# Patient Record
Sex: Male | Born: 1975 | Race: White | Hispanic: No | Marital: Married | Smoking: Current every day smoker
Health system: Southern US, Community
[De-identification: ages and names within clinical notes are randomized; demographics above are authoritative.]

## PROBLEM LIST (undated history)

## (undated) HISTORY — PX: FOOT SURGERY: SHX648

## (undated) HISTORY — PX: OTHER SURGICAL HISTORY: SHX169

---

## 2004-11-23 ENCOUNTER — Emergency Department (HOSPITAL_COMMUNITY): Admission: EM | Admit: 2004-11-23 | Discharge: 2004-11-23 | Payer: Self-pay | Admitting: Emergency Medicine

## 2019-04-14 ENCOUNTER — Other Ambulatory Visit: Payer: Self-pay | Admitting: Family Medicine

## 2019-04-14 DIAGNOSIS — R1011 Right upper quadrant pain: Secondary | ICD-10-CM

## 2019-04-24 ENCOUNTER — Other Ambulatory Visit (HOSPITAL_COMMUNITY): Payer: Self-pay | Admitting: Family Medicine

## 2019-04-24 ENCOUNTER — Other Ambulatory Visit: Payer: Self-pay | Admitting: Family Medicine

## 2019-04-24 ENCOUNTER — Ambulatory Visit
Admission: RE | Admit: 2019-04-24 | Discharge: 2019-04-24 | Disposition: A | Discharge status: Home / Self Care | Payer: BLUE CROSS/BLUE SHIELD | Source: Ambulatory Visit | Attending: Family Medicine | Admitting: Family Medicine

## 2019-04-24 DIAGNOSIS — R1011 Right upper quadrant pain: Secondary | ICD-10-CM

## 2019-04-28 ENCOUNTER — Other Ambulatory Visit: Payer: Self-pay

## 2019-04-28 ENCOUNTER — Ambulatory Visit (HOSPITAL_COMMUNITY)
Admission: RE | Admit: 2019-04-28 | Discharge: 2019-04-28 | Disposition: A | Discharge status: Home / Self Care | Payer: BLUE CROSS/BLUE SHIELD | Source: Ambulatory Visit | Attending: Family Medicine | Admitting: Family Medicine

## 2019-04-28 DIAGNOSIS — R1011 Right upper quadrant pain: Secondary | ICD-10-CM | POA: Diagnosis present

## 2019-04-28 MED ORDER — TECHNETIUM TC 99M MEBROFENIN IV KIT
5.0000 | PACK | Freq: Once | INTRAVENOUS | Status: AC | PRN
  Administered 2019-04-28: 5 via INTRAVENOUS

## 2019-05-01 ENCOUNTER — Other Ambulatory Visit: Payer: Self-pay | Admitting: Family Medicine

## 2019-05-01 DIAGNOSIS — R1011 Right upper quadrant pain: Secondary | ICD-10-CM

## 2019-05-02 ENCOUNTER — Ambulatory Visit
Admission: RE | Admit: 2019-05-02 | Discharge: 2019-05-02 | Disposition: A | Discharge status: Home / Self Care | Payer: BLUE CROSS/BLUE SHIELD | Source: Ambulatory Visit | Attending: Family Medicine | Admitting: Family Medicine

## 2019-05-02 ENCOUNTER — Other Ambulatory Visit: Payer: Self-pay

## 2019-05-02 DIAGNOSIS — R1011 Right upper quadrant pain: Secondary | ICD-10-CM

## 2019-05-02 MED ORDER — IOPAMIDOL (ISOVUE-300) INJECTION 61%
100.0000 mL | Freq: Once | INTRAVENOUS | Status: AC | PRN
  Administered 2019-05-02: 15:00:00 100 mL via INTRAVENOUS

## 2019-11-21 ENCOUNTER — Other Ambulatory Visit: Payer: Self-pay | Admitting: Obstetrics and Gynecology

## 2019-11-21 ENCOUNTER — Ambulatory Visit
Admission: RE | Admit: 2019-11-21 | Discharge: 2019-11-21 | Disposition: A | Discharge status: Home / Self Care | Payer: BC Managed Care – PPO | Source: Ambulatory Visit | Attending: Obstetrics and Gynecology | Admitting: Obstetrics and Gynecology

## 2019-11-21 DIAGNOSIS — R52 Pain, unspecified: Secondary | ICD-10-CM

## 2020-07-27 IMAGING — US ULTRASOUND ABDOMEN LIMITED
1 series · 14 of 25 positions shown · non-contrast
Comparison: None.

CLINICAL DATA: Right upper quadrant abdominal pain

EXAM:
ULTRASOUND ABDOMEN LIMITED RIGHT UPPER QUADRANT

[Series 1: ultrasound abdomen limited · 0.23mm/px · 14 of 37 slices shown]
[im 1/37]
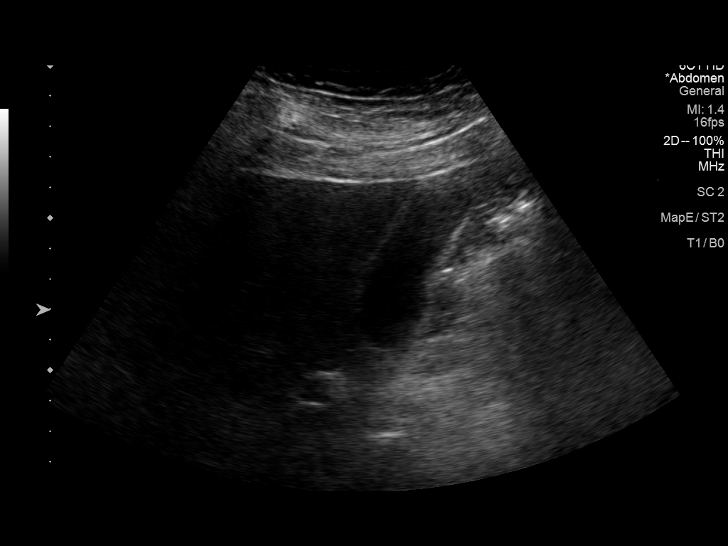
[im 4/37]
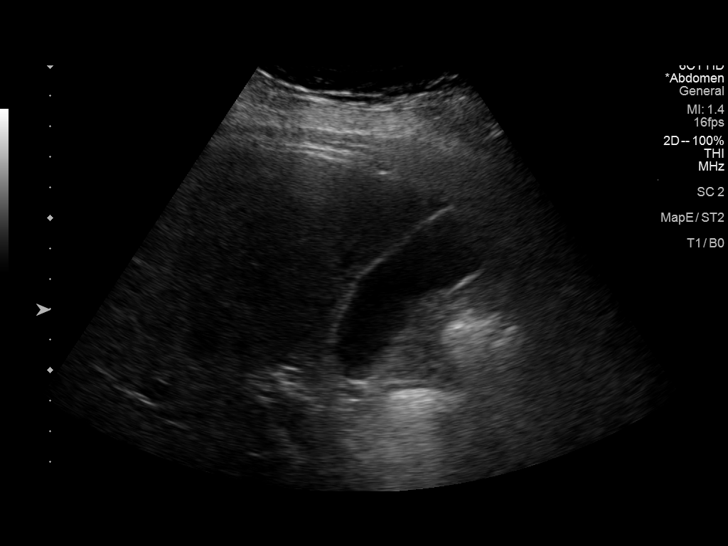
[im 7/37]
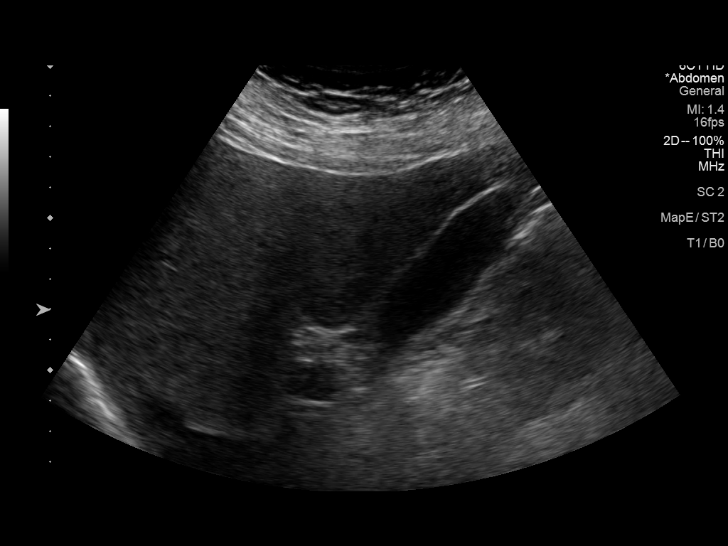
[im 10/37]
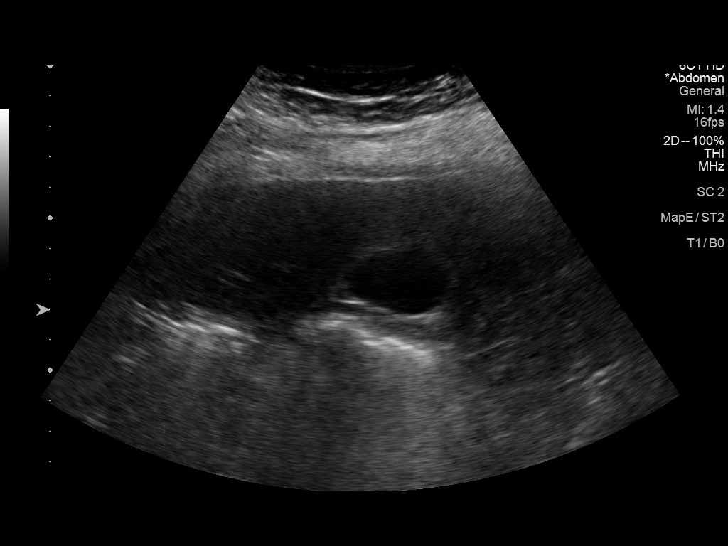
[im 13/37]
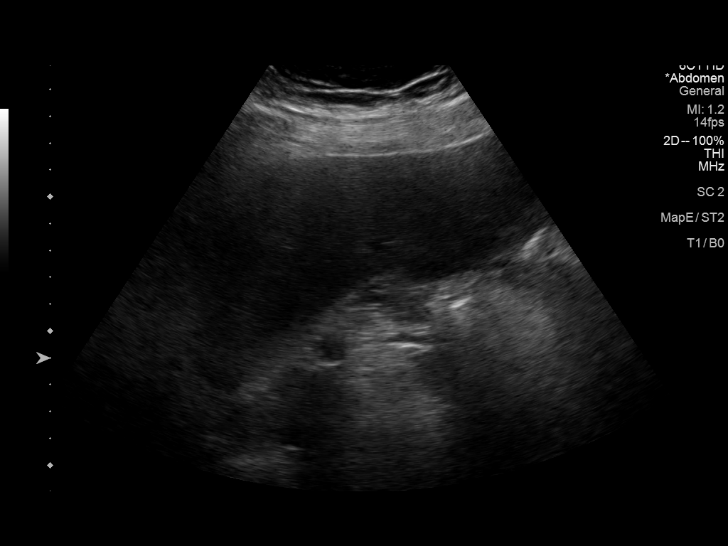
[im 14/37]
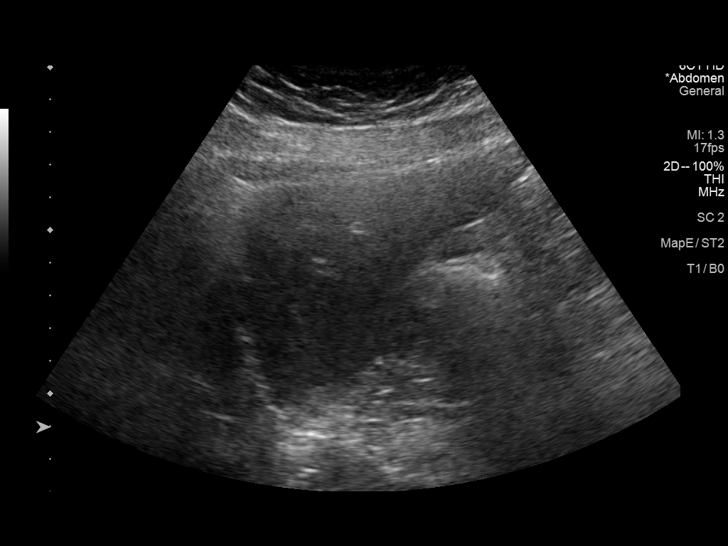
[im 17/37]
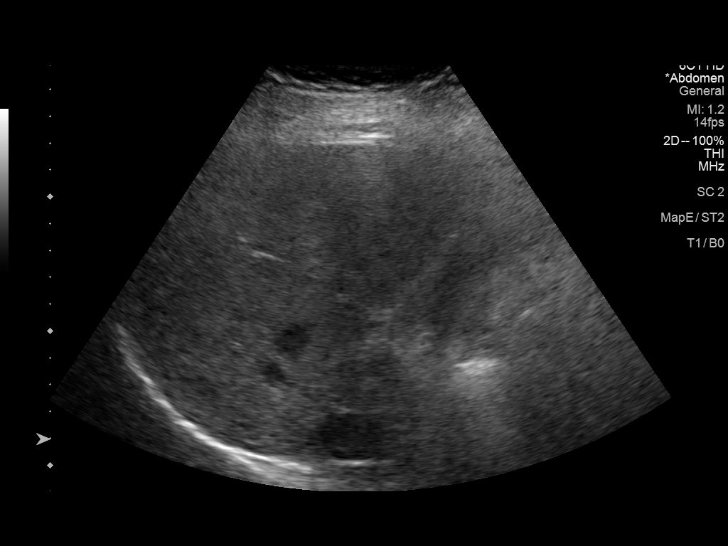
[im 20/37]
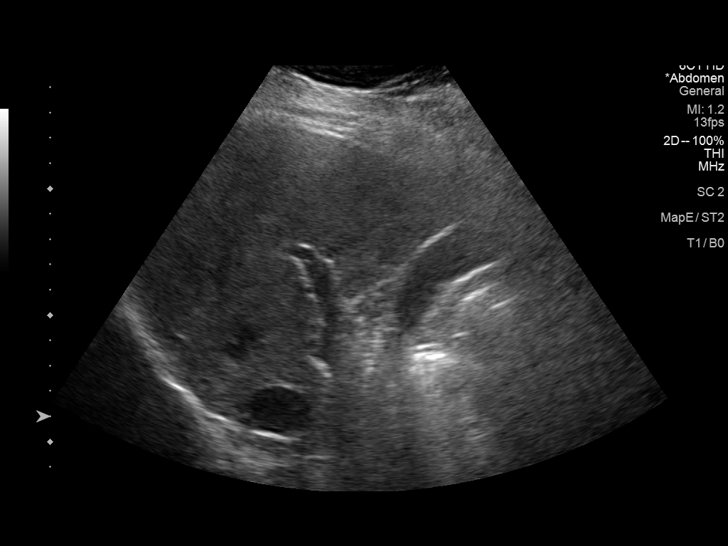
[im 23/37]
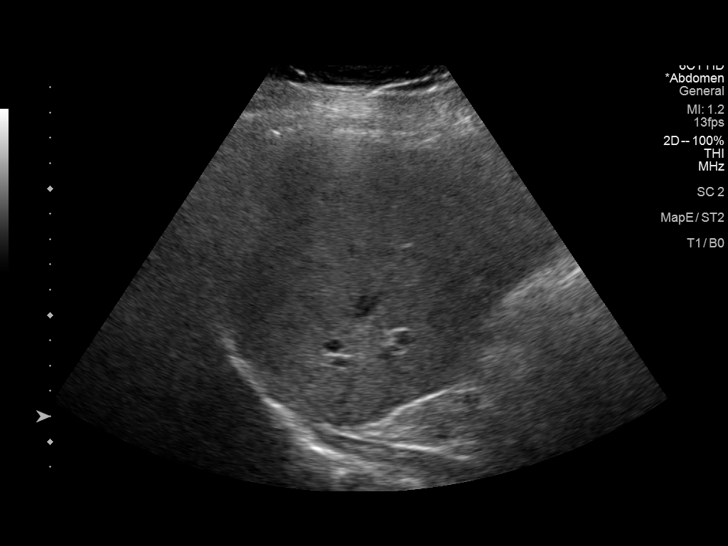
[im 25/37]
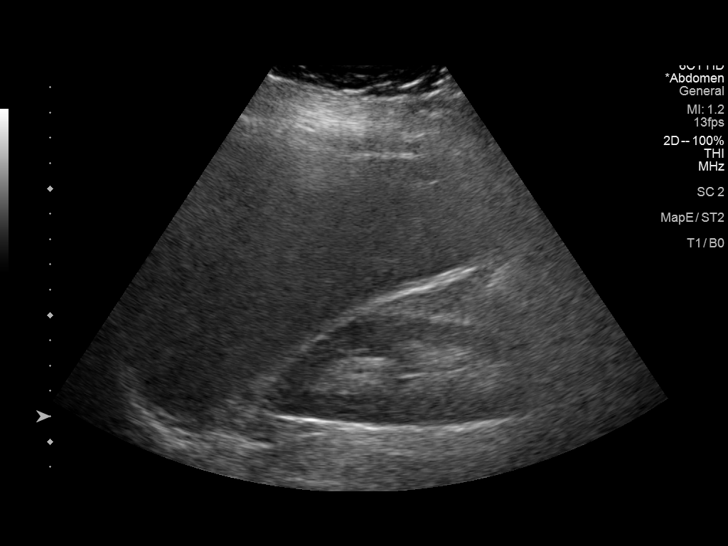
[im 28/37]
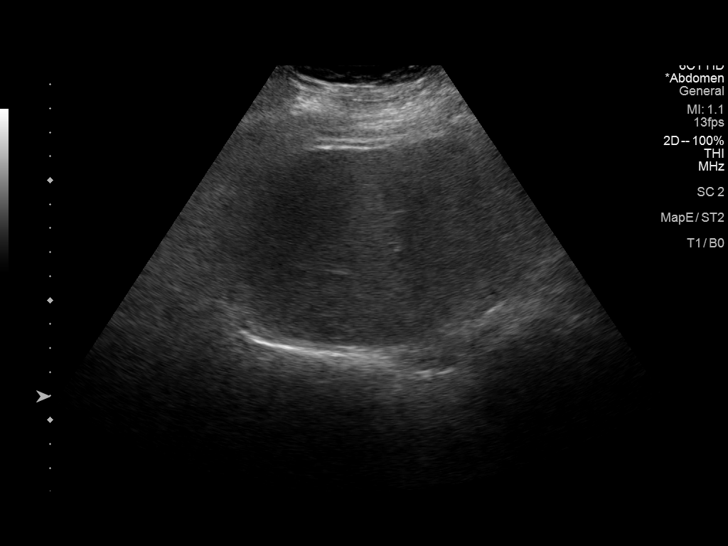
[im 31/37]
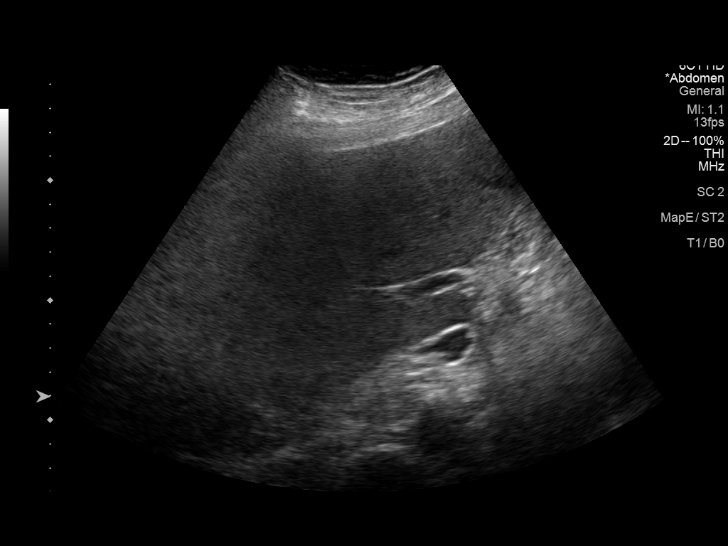
[im 34/37]
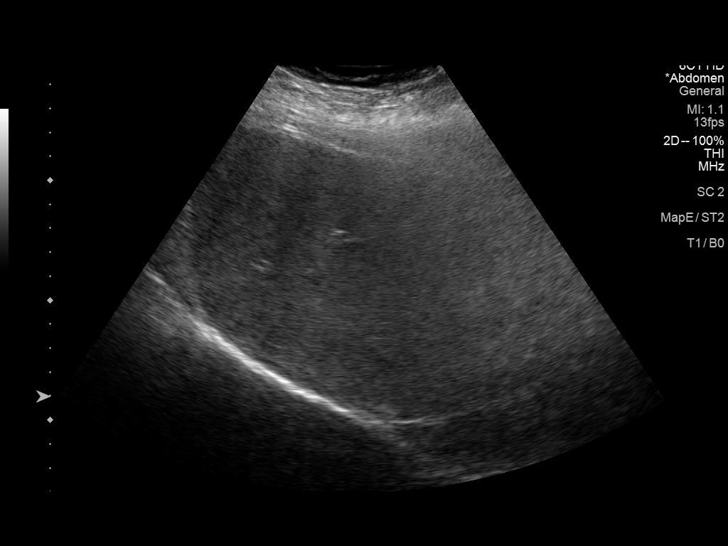
[im 37/37]
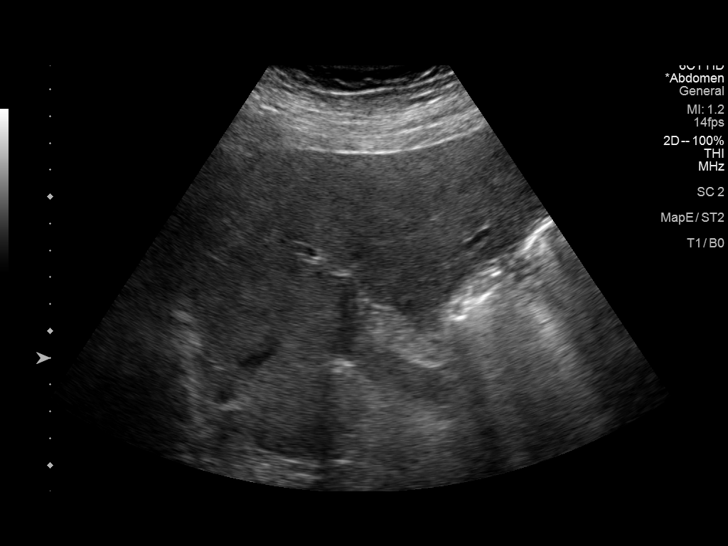

[14 of 25 positions shown; findings below may reference images not displayed]

FINDINGS: Gallbladder:

No gallstones or wall thickening visualized. No sonographic Murphy
sign noted by sonographer.

Common bile duct:

Diameter: Normal caliber, 4 mm

Liver:

No focal lesion identified. Within normal limits in parenchymal
echogenicity. Portal vein is patent on color Doppler imaging with
normal direction of blood flow towards the liver.
IMPRESSION: Unremarkable right upper quadrant ultrasound.

## 2021-02-23 IMAGING — CR DG LUMBAR SPINE 2-3V
3 series · 3 of 3 positions shown · non-contrast
Comparison: None.

CLINICAL DATA: Low-back pain

EXAM:
LUMBAR SPINE - 2-3 VIEW

[t l-spine a.p.]
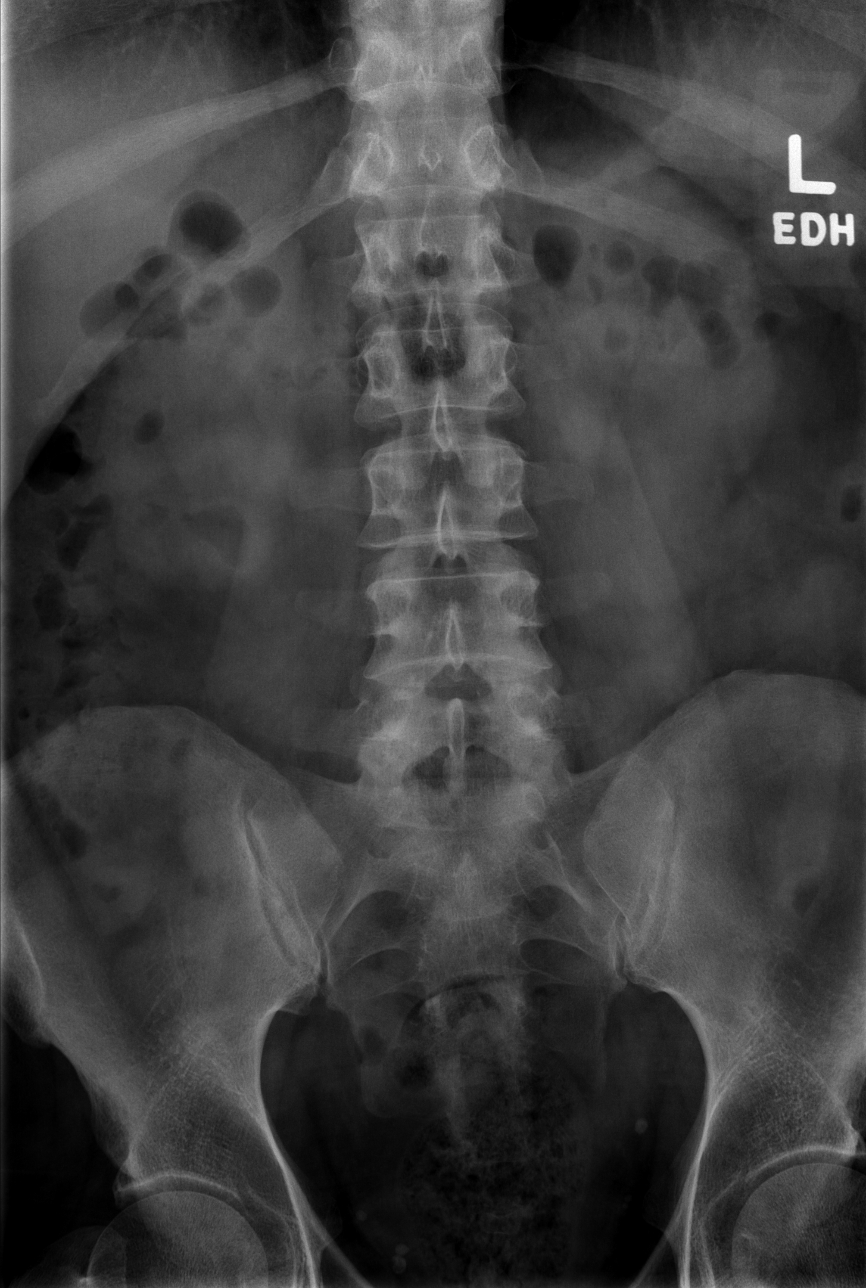

[t l-spine lat]
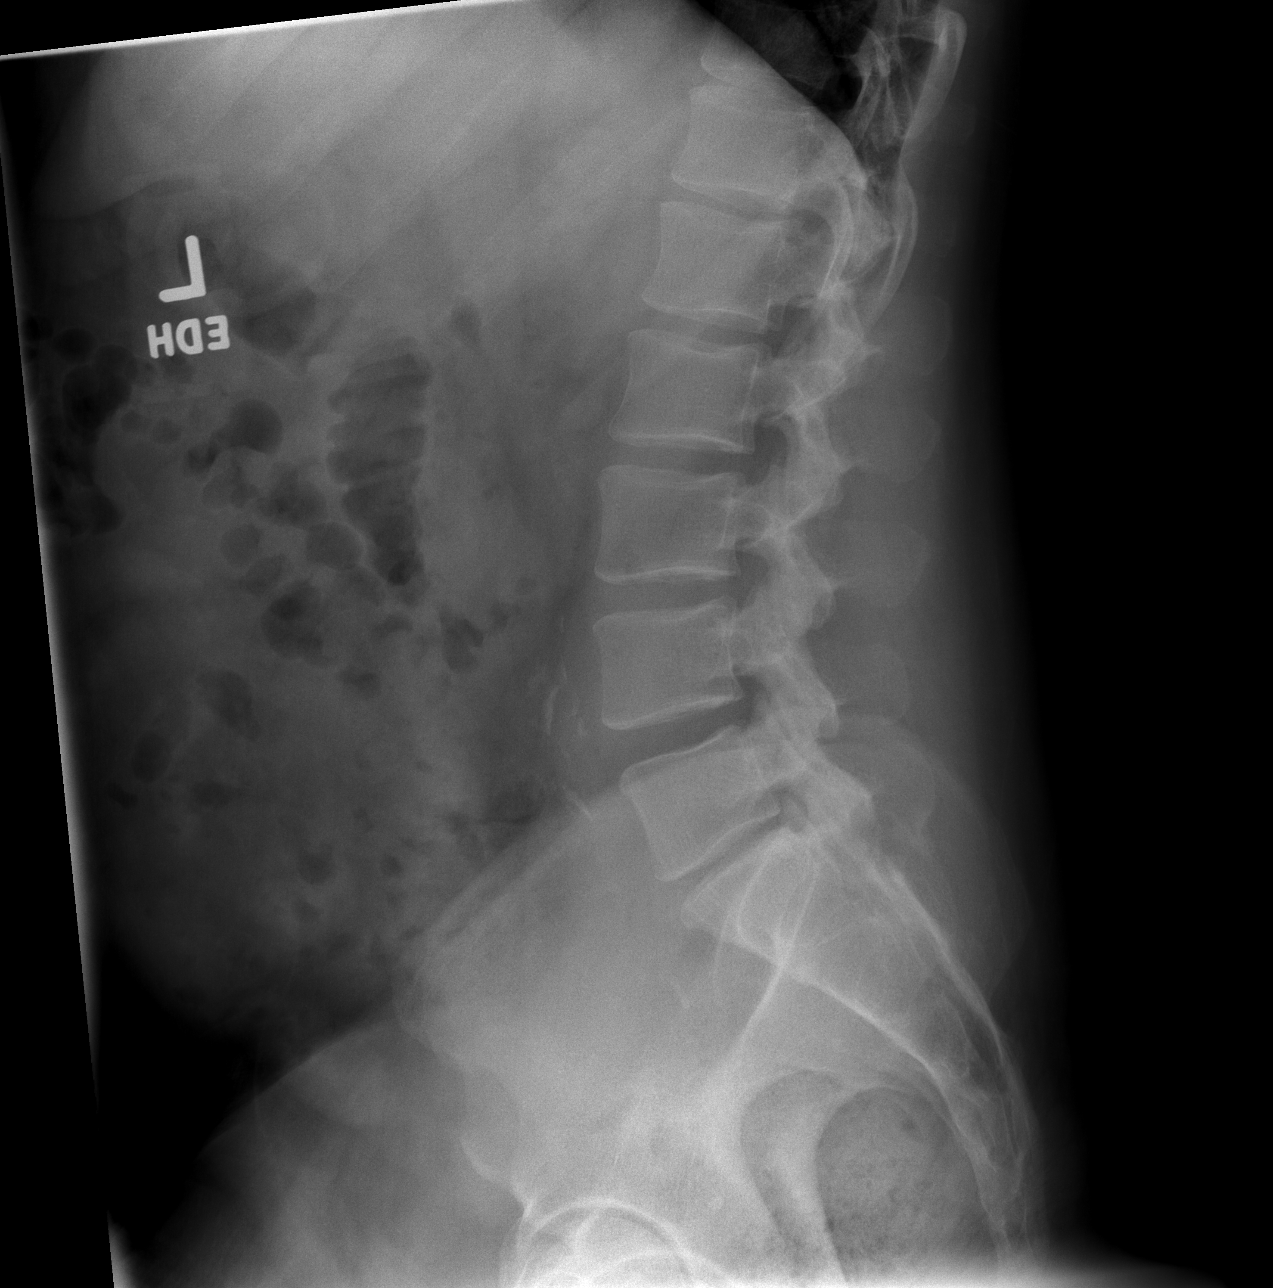

[t l-spine l5-s1 spot]
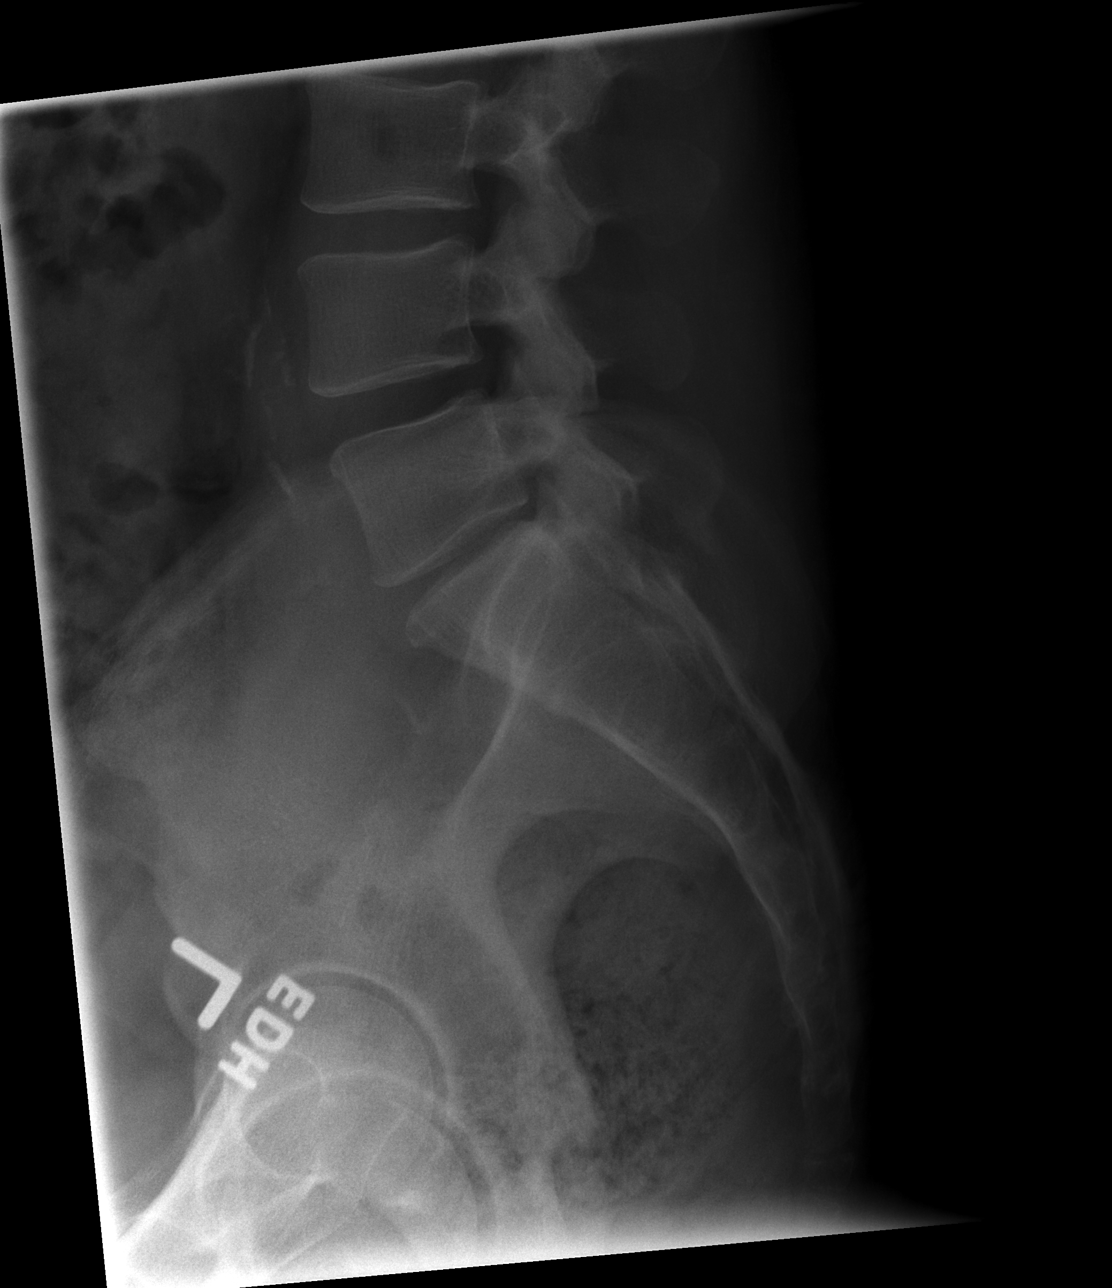

[3 of 3 positions shown; findings below may reference images not displayed]

FINDINGS: Degenerative disc disease L5-S1 with disc space narrowing. No acute
fracture. Pelvis unremarkable.
IMPRESSION: L5-S1 degenerative disc disease.

## 2022-09-15 ENCOUNTER — Other Ambulatory Visit: Payer: Self-pay

## 2022-09-15 ENCOUNTER — Emergency Department (HOSPITAL_COMMUNITY): Payer: Worker's Compensation

## 2022-09-15 ENCOUNTER — Encounter (HOSPITAL_COMMUNITY): Payer: Self-pay | Admitting: Emergency Medicine

## 2022-09-15 ENCOUNTER — Emergency Department (HOSPITAL_COMMUNITY)
Admission: EM | Admit: 2022-09-15 | Discharge: 2022-09-15 | Disposition: A | Discharge status: Home / Self Care | Payer: Worker's Compensation | Attending: Emergency Medicine | Admitting: Emergency Medicine

## 2022-09-15 DIAGNOSIS — Y990 Civilian activity done for income or pay: Secondary | ICD-10-CM | POA: Insufficient documentation

## 2022-09-15 DIAGNOSIS — W010XXA Fall on same level from slipping, tripping and stumbling without subsequent striking against object, initial encounter: Secondary | ICD-10-CM | POA: Insufficient documentation

## 2022-09-15 DIAGNOSIS — S8991XA Unspecified injury of right lower leg, initial encounter: Secondary | ICD-10-CM | POA: Diagnosis present

## 2022-09-15 DIAGNOSIS — S82831A Other fracture of upper and lower end of right fibula, initial encounter for closed fracture: Secondary | ICD-10-CM | POA: Insufficient documentation

## 2022-09-15 MED ORDER — IBUPROFEN 200 MG PO TABS
600.0000 mg | ORAL_TABLET | Freq: Once | ORAL | Status: AC
  Administered 2022-09-15: 600 mg via ORAL
  Filled 2022-09-15: qty 1

## 2022-09-15 MED ORDER — OXYCODONE-ACETAMINOPHEN 5-325 MG PO TABS
1.0000 | ORAL_TABLET | Freq: Once | ORAL | Status: AC
  Administered 2022-09-15: 1 via ORAL
  Filled 2022-09-15: qty 1

## 2022-09-15 MED ORDER — OXYCODONE-ACETAMINOPHEN 5-325 MG PO TABS: 1.0000 | ORAL_TABLET | Freq: Four times a day (QID) | ORAL | 0 refills | Status: AC | PRN

## 2022-09-15 NOTE — Discharge Instructions (Signed)
SPLINT INSTRUCTIONS   Call Dr. Dierdre Highman office to make an appointment with orthopedics.  You were seen for a fracture of your fibula.  WEIGHT BEARING Your weight bearing status is: non weight bearing   This will need to be maintained until you are seen by in the orthopedic clinic.     SPLINT/CAST CARE DO NOT REMOVE: Please do NOT remove your splint/cast.  You should keep your dressing clean, dry, and intact. Showering should be done with a plastic bag over your cast/splint and securely taped above your cast/splint. Ice can help with pain. However, avoid getting your splint/cast wet.  Swelling and bruising can be normal after an injury.  Keep your extremity elevated as much as possible this will help decrease your swelling and improve your pain.  If any of the below should occur, please call the orthopedist or go to a local ED. Soaking through of dressing/splint Pain unable to be controlled by prescribed pain medication  MEDICATIONS Multimodal Pain Medication  The most important aspect of pain management is elevation of your injured extremity above the level of your heart with pillows, blankets, etc. Swelling can account for a large amount of your pain.  We recommend multimodal pain control, these medications are used to provide you with as optimal pain control as possible. Most pain can be well managed with Tylenol.  Scheduled tylenol 1000 mg of tylenol every 8 hours (unless contraindicated due to liver problems) Ibuprofen: 600 mg every 8 hours as needed for pain (unless contraindicated due to kidney disease, GI bleed, etc) Do not take on an empty stomach. If you have blood in your stool or significant abdominal pain, stop taking medication immediately and call your PCP or go to the ED.  Percocet every 6 hours as needed Given that [facility] only allows providers to prescribe a limited amount of narcotics, we recommend that narcotics are used sparingly if you have been  prescribed any

## 2022-09-15 NOTE — ED Provider Notes (Signed)
[facility] EMERGENCY DEPARTMENT Provider Note   CSN: [id number] Arrival date & time: 09/15/22  1602     History  Chief Complaint  Patient presents with   Leg Injury    Benjamin Neal is a 46 y.o. male.  With no significant past medical history who presents with right ankle pain and swelling after slipping down a ramp today and getting his leg caught in between 2 tanks.  He was at work when this happened.  He did not hit his head or lose consciousness.  He developed pain and swelling in his right lateral ankle.  He has not been able to bear weight on his ankle since the injury.  He denies any numbness or tingling or loss of sensation.  He denies any preceding symptoms leading to slip and fall.  HPI     Home Medications Prior to Admission medications   Medication Sig Start Date End Date Taking? Authorizing Provider  oxyCODONE-acetaminophen (PERCOCET/ROXICET) 5-325 MG tablet Take 1 tablet by mouth every 6 (six) hours as needed for up to 9 doses for severe pain. 09/15/22  Yes Mardene Sayer, MD      Allergies    Patient has no known allergies.    Review of Systems   Review of Systems  Physical Exam Updated Vital Signs BP (!) 121/102 (BP Location: Right Arm)   Pulse (!) 103   Temp 99.8 F (37.7 C) (Oral)   Resp 18   Ht 6' (1.829 m)   Wt 95.3 kg   SpO2 95%   BMI 28.48 kg/m  Physical Exam Constitutional: Alert and oriented. Well appearing and in no distress. Eyes: Conjunctivae are normal. ENT      Head: Normocephalic and atraumatic. Cardiovascular: S1, S2, regular rate with equal palpable DP pulses, warm well perfused with cap refill less than 2 seconds Respiratory: Normal respiratory effort.  Gastrointestinal: Soft  Musculoskeletal: Normal range of motion in all extremities.      Right lower leg: Swelling and tenderness overlying the right lateral distal fibula and ankle.  Sensation intact distally.  No pain distal or proximal to right distal  fibula.  Capillary refill less than 2 seconds.  Wiggling toes.  Full strength of knee and hip intact.  No pain with logroll.      Left lower leg: No tenderness or edema. Neurologic: Normal speech and language. No gross focal neurologic deficits are appreciated. Skin: Skin is warm, dry .  Small hemostatic abrasion overlying the right anterior shin. Psychiatric: Mood and affect are normal. Speech and behavior are normal.  ED Results / Procedures / Treatments   Labs (all labs ordered are listed, but only abnormal results are displayed) Labs Reviewed - No data to display  EKG None  Radiology DG Ankle Complete Right  Result Date: 09/15/2022 CLINICAL DATA:  Slipped at work. Right leg on pinned between 2 tanks. Right ankle swelling. Abrasions right shin. EXAM: RIGHT ANKLE - COMPLETE 3+ VIEW; RIGHT TIBIA AND FIBULA - 2 VIEW COMPARISON:  None available FINDINGS: Right ankle: The ankle mortise is symmetric and intact. There is subtle periosteal elevation at the lateral aspect of the distal fibular metadiaphysis on frontal view. On lateral view there is posterosuperior to anterior inferior linear lucency minimally visualized extending through the posterior cortex of the distal fibular diaphysis, and then obscured by the overlying tibia. Minimal linear bone density within the distal tibiofibular syndesmosis on oblique view. The ankle mortise is symmetric and intact. Moderate lateral malleolar soft  tissue swelling. Right tibia and fibula: No acute fracture is seen within the more proximal right tibia or fibula. No knee joint effusion. IMPRESSION: 1. Acute nondisplaced fracture of the distal fibular diaphysis and metaphysis. Tiny linear bone density overlying distal tibiofibular syndesmosis on oblique view may represent a tiny cortical fragment from this fracture or an additional avulsion fracture. 2. Moderate lateral malleolar soft tissue swelling. Electronically Signed   By: Neita Garnet M.D.   On: 09/15/2022  17:06   DG Tibia/Fibula Right  Result Date: 09/15/2022 CLINICAL DATA:  Slipped at work. Right leg on pinned between 2 tanks. Right ankle swelling. Abrasions right shin. EXAM: RIGHT ANKLE - COMPLETE 3+ VIEW; RIGHT TIBIA AND FIBULA - 2 VIEW COMPARISON:  None available FINDINGS: Right ankle: The ankle mortise is symmetric and intact. There is subtle periosteal elevation at the lateral aspect of the distal fibular metadiaphysis on frontal view. On lateral view there is posterosuperior to anterior inferior linear lucency minimally visualized extending through the posterior cortex of the distal fibular diaphysis, and then obscured by the overlying tibia. Minimal linear bone density within the distal tibiofibular syndesmosis on oblique view. The ankle mortise is symmetric and intact. Moderate lateral malleolar soft tissue swelling. Right tibia and fibula: No acute fracture is seen within the more proximal right tibia or fibula. No knee joint effusion. IMPRESSION: 1. Acute nondisplaced fracture of the distal fibular diaphysis and metaphysis. Tiny linear bone density overlying distal tibiofibular syndesmosis on oblique view may represent a tiny cortical fragment from this fracture or an additional avulsion fracture. 2. Moderate lateral malleolar soft tissue swelling. Electronically Signed   By: Neita Garnet M.D.   On: 09/15/2022 17:06    Procedures Procedures    Medications Ordered in ED Medications  oxyCODONE-acetaminophen (PERCOCET/ROXICET) 5-325 MG per tablet 1 tablet (1 tablet Oral Given 09/15/22 2039)  ibuprofen (ADVIL) tablet 600 mg (600 mg Oral Given 09/15/22 2039)    ED Course/ Medical Decision Making/ A&P                           Medical Decision Making Benjamin Neal is a 46 y.o. male.  With no significant past medical history who presents with right ankle pain and swelling after slipping down a ramp today and getting his leg caught in between 2 tanks.  Patient had mechanical fall, no  concerning preceding symptoms, no need for further work-up.  He had pain and swelling to right lateral ankle and distal fibula.  No head injury, no need for head imaging.  Imaging obtained of right tibia-fibula and right ankle showed evidence of acute nondisplaced fracture of distal fibular diaphysis and metaphysis and possible small avulsion fracture.  He is neurovascularly intact.  No concern for ischemic injury.  Placed in posterior short leg splint with stirrup.  Provided referral to orthopedics and advised close follow-up with orthopedics.  Advised continued Tylenol and ibuprofen for pain control as well as short course of Percocet as needed.  Strict return precaution discussed.  Safer discharge home.  Risk Prescription drug management.    Final Clinical Impression(s) / ED Diagnoses Final diagnoses:  Closed fracture of distal end of right fibula, unspecified fracture morphology, initial encounter    Rx / DC Orders ED Discharge Orders          Ordered    AMB referral to orthopedics        09/15/22 2033    oxyCODONE-acetaminophen (PERCOCET/ROXICET) 5-325 MG tablet  Every 6 hours PRN        09/15/22 2036              Elgie Congo, MD 09/15/22 2043

## 2022-09-15 NOTE — ED Triage Notes (Signed)
Patient arrives in wheelchair by POV reports while at work slipping and his right leg got pinned between two tanks. Swelling to right ankle. Abrasion to right shin.

## 2022-09-15 NOTE — ED Provider Triage Note (Signed)
Emergency Medicine Provider Triage Evaluation Note  Benjamin Neal , a 46 y.o. male  was evaluated in triage.  Pt complains of  ankle and shin pain after fall in work.   Review of Systems  Per HPI  Physical Exam  BP (!) 124/98 (BP Location: Right Arm)   Pulse 71   Temp 98.8 F (37.1 C) (Oral)   Resp 14   Ht 6' (1.829 m)   Wt 95.3 kg   SpO2 93%   BMI 28.48 kg/m  Gen:   Awake, no distress   Resp:  Normal effort  MSK:   Moves extremities without difficulty  Other:  Right ankle pain and swelling, worse to the lateral area.  Abrasion to the anterior shin, some tenderness to palpation surrounding on the tibia.  Full ROM to knee without any issues.  DP and PT are palpable, cap refills less than 2  Medical Decision Making  Medically screening exam initiated at 4:26 PM.  Appropriate orders placed.  Mia [place] was informed that the remainder of the evaluation will be completed by another provider, this initial triage assessment does not replace that evaluation, and the importance of remaining in the ED until their evaluation is complete.     Sherrill Raring, PA-C 09/15/22 1627

## 2022-09-15 NOTE — ED Notes (Signed)
Pt refused to get in gown.  

## 2022-09-15 NOTE — Progress Notes (Signed)
Orthopedic Tech Progress Note Patient Details:  Benjamin Neal Oct 22, 1976 [id number]  Ortho Devices Type of Ortho Device: Crutches, Short leg splint Ortho Device/Splint Location: RUE Ortho Device/Splint Interventions: Ordered, Application, Adjustment   Post Interventions Patient Tolerated: Well Instructions Provided: Adjustment of device, Care of device, Poper ambulation with device  [name] [name] 09/15/2022, 8:28 PM

## 2023-08-27 DIAGNOSIS — K029 Dental caries, unspecified: Secondary | ICD-10-CM | POA: Diagnosis not present

## 2024-12-02 ENCOUNTER — Emergency Department (HOSPITAL_COMMUNITY)
Admission: EM | Admit: 2024-12-02 | Discharge: 2024-12-02 | Disposition: A | Discharge status: Home / Self Care | Attending: Emergency Medicine | Admitting: Emergency Medicine

## 2024-12-02 ENCOUNTER — Emergency Department (HOSPITAL_COMMUNITY)

## 2024-12-02 ENCOUNTER — Other Ambulatory Visit: Payer: Self-pay

## 2024-12-02 ENCOUNTER — Encounter (HOSPITAL_COMMUNITY): Payer: Self-pay

## 2024-12-02 DIAGNOSIS — M5441 Lumbago with sciatica, right side: Secondary | ICD-10-CM | POA: Diagnosis not present

## 2024-12-02 DIAGNOSIS — M25551 Pain in right hip: Secondary | ICD-10-CM | POA: Diagnosis not present

## 2024-12-02 DIAGNOSIS — L03211 Cellulitis of face: Secondary | ICD-10-CM | POA: Diagnosis not present

## 2024-12-02 DIAGNOSIS — M7989 Other specified soft tissue disorders: Secondary | ICD-10-CM

## 2024-12-02 DIAGNOSIS — M5431 Sciatica, right side: Secondary | ICD-10-CM | POA: Diagnosis not present

## 2024-12-02 LAB — I-STAT CHEM 8, ED
BUN: 14 mg/dL (ref 6–20)
Calcium, Ion: 1.04 mmol/L — ABNORMAL LOW (ref 1.15–1.40)
Chloride: 102 mmol/L (ref 98–111)
Creatinine, Ser: 1.1 mg/dL (ref 0.61–1.24)
Glucose, Bld: 109 mg/dL — ABNORMAL HIGH (ref 70–99)
HCT: 48 % (ref 39.0–52.0)
Hemoglobin: 16.3 g/dL (ref 13.0–17.0)
Potassium: 4.1 mmol/L (ref 3.5–5.1)
Sodium: 138 mmol/L (ref 135–145)
TCO2: 24 mmol/L (ref 22–32)

## 2024-12-02 MED ORDER — DEXAMETHASONE SOD PHOSPHATE PF 10 MG/ML IJ SOLN
10.0000 mg | Freq: Once | INTRAMUSCULAR | Status: AC
  Administered 2024-12-02: 10 mg via INTRAMUSCULAR

## 2024-12-02 MED ORDER — DOXYCYCLINE HYCLATE 100 MG PO CAPS: 100.0000 mg | ORAL_CAPSULE | Freq: Two times a day (BID) | ORAL | 0 refills | Status: AC

## 2024-12-02 MED ORDER — IBUPROFEN 800 MG PO TABS: 800.0000 mg | ORAL_TABLET | Freq: Three times a day (TID) | ORAL | 0 refills | Status: AC | PRN

## 2024-12-02 MED ORDER — KETOROLAC TROMETHAMINE 30 MG/ML IJ SOLN
30.0000 mg | Freq: Once | INTRAMUSCULAR | Status: AC
  Administered 2024-12-02: 30 mg via INTRAMUSCULAR
  Filled 2024-12-02: qty 1

## 2024-12-02 MED ORDER — DOXYCYCLINE HYCLATE 100 MG PO TABS
100.0000 mg | ORAL_TABLET | Freq: Once | ORAL | Status: AC
  Administered 2024-12-02: 100 mg via ORAL
  Filled 2024-12-02: qty 1

## 2024-12-02 MED ORDER — OXYCODONE-ACETAMINOPHEN 5-325 MG PO TABS
1.0000 | ORAL_TABLET | ORAL | Status: DC | PRN
  Administered 2024-12-02: 1 via ORAL
  Filled 2024-12-02 (×2): qty 1

## 2024-12-02 MED ORDER — GABAPENTIN 300 MG PO CAPS: 300.0000 mg | ORAL_CAPSULE | Freq: Three times a day (TID) | ORAL | 0 refills | Status: AC | PRN

## 2024-12-02 MED ORDER — METHOCARBAMOL 500 MG PO TABS: 500.0000 mg | ORAL_TABLET | Freq: Three times a day (TID) | ORAL | 0 refills | Status: AC | PRN

## 2024-12-02 NOTE — ED Provider Notes (Signed)
 "  Emergency Department Provider Note   I have reviewed the triage vital signs and the nursing notes.   HISTORY  Chief Complaint Hip Pain   HPI Benjamin Neal is a 48 y.o. male presents to the ED right hip pain and face rash. Patient reports 6 days of symptoms.  Patient having pain into the right buttock down the thigh.  No leg swelling.  No injuries.  Pain is somewhat worse with moving the hip.  No knee or foot pain.  He also notes swelling to the left cheek.  He had a pimple in this area and squeezed it but it has become more inflamed.  No drainage.   History reviewed. No pertinent past medical history.  Review of Systems  Constitutional: No fever/chills Cardiovascular: Denies chest pain. Respiratory: Denies shortness of breath. Gastrointestinal: No abdominal pain.  No nausea, no vomiting.   Musculoskeletal: Positive right hip and leg pain.  Skin: Positive face rash.  Neurological: Negative for headaches, focal weakness or numbness.   ____________________________________________   PHYSICAL EXAM:  VITAL SIGNS: ED Triage Vitals  Encounter Vitals Group     BP 12/02/24 1010 (!) 141/93     Pulse Rate 12/02/24 1010 70     Resp 12/02/24 1010 18     Temp 12/02/24 1010 98.4 F (36.9 C)     Temp Source 12/02/24 1010 Oral     SpO2 12/02/24 1010 98 %     Weight 12/02/24 1013 225 lb (102.1 kg)     Height 12/02/24 1013 5' 11 (1.803 m)   Constitutional: Alert and oriented. Well appearing and in no acute distress. Eyes: Conjunctivae are normal. Head: Atraumatic. Nose: No congestion/rhinnorhea. Mouth/Throat: Mucous membranes are moist.   Neck: No stridor.   Cardiovascular: Normal rate, regular rhythm. Good peripheral circulation. Grossly normal heart sounds.   Respiratory: Normal respiratory effort.  No retractions. Lungs CTAB. Gastrointestinal: Soft and nontender. No distention.  Musculoskeletal: Normal range of motion of the right hip, knee, ankle.  No tenderness to the  lumbar spine. Neurologic:  Normal speech and language. No gross focal neurologic deficits are appreciated.  Skin:  Skin is warm.  Erythema with warmth to the left medial cheek with small, pimple-like area just lateral of the nose.   ____________________________________________   LABS (all labs ordered are listed, but only abnormal results are displayed)  Labs Reviewed  I-STAT CHEM 8, ED - Abnormal; Notable for the following components:      Result Value   Glucose, Bld 109 (*)    Calcium, Ion 1.04 (*)    All other components within normal limits   ____________________________________________  RADIOLOGY  VAS US  LOWER EXTREMITY VENOUS (DVT) (7a-5p) Result Date: 12/02/2024  Lower Venous DVT Study Patient Name:  Benjamin Neal  Date of Exam:   12/02/2024 Medical Rec #: [id number]       Accession #:    [id number] Date of Birth: [birth year]      Patient Gender: M Patient Age:   48 years Exam Location:  [facility] Procedure:      VAS US  LOWER EXTREMITY VENOUS (DVT) Referring Phys: LONNI CAMP --------------------------------------------------------------------------------  Indications: Right lateral hip pain radiating down lateral/posterior thigh and calf.  Comparison Study: No prior study on file Performing Technologist: Alberta Lis RVS  Examination Guidelines: A complete evaluation includes B-mode imaging, spectral Doppler, color Doppler, and power Doppler as needed of all accessible portions of each vessel. Bilateral testing is considered an integral part of a complete  examination. Limited examinations for reoccurring indications may be performed as noted. The reflux portion of the exam is performed with the patient in reverse Trendelenburg.  +---------+---------------+---------+-----------+----------+--------------+ RIGHT    CompressibilityPhasicitySpontaneityPropertiesThrombus Aging +---------+---------------+---------+-----------+----------+--------------+ CFV      Full            Yes      Yes                                 +---------+---------------+---------+-----------+----------+--------------+ SFJ      Full                                                        +---------+---------------+---------+-----------+----------+--------------+ FV Prox  Full           Yes      Yes                                 +---------+---------------+---------+-----------+----------+--------------+ FV Mid                  Yes      Yes                                 +---------+---------------+---------+-----------+----------+--------------+ FV DistalFull                                                        +---------+---------------+---------+-----------+----------+--------------+ PFV      Full           Yes      Yes                                 +---------+---------------+---------+-----------+----------+--------------+ POP      Full           Yes      Yes                                 +---------+---------------+---------+-----------+----------+--------------+ PTV      Full                                                        +---------+---------------+---------+-----------+----------+--------------+ PERO     Full                                                        +---------+---------------+---------+-----------+----------+--------------+ Gastroc  Full                                                        +---------+---------------+---------+-----------+----------+--------------+   +----+---------------+---------+-----------+----------+--------------+  LEFTCompressibilityPhasicitySpontaneityPropertiesThrombus Aging +----+---------------+---------+-----------+----------+--------------+ CFV Full           Yes      Yes                                 +----+---------------+---------+-----------+----------+--------------+ SFJ Full                                                         +----+---------------+---------+-----------+----------+--------------+    Summary: RIGHT: - There is no evidence of deep vein thrombosis in the lower extremity.  - No cystic structure found in the popliteal fossa.  LEFT: - No evidence of common femoral vein obstruction.   *See table(s) above for measurements and observations.    Preliminary    DG Hip Unilat W or Wo Pelvis 2-3 Views Right Result Date: 12/02/2024 CLINICAL DATA:  Hip pain. EXAM: DG HIP (WITH OR WITHOUT PELVIS) 2-3V RIGHT COMPARISON:  None Available. FINDINGS: There is no evidence of hip fracture or dislocation. There is no evidence of arthropathy or other focal bone abnormality. IMPRESSION: Negative. Electronically Signed   By: Vanetta Chou M.D.   On: 12/02/2024 12:43    ____________________________________________   PROCEDURES  Procedure(s) performed:   Procedures  None  ____________________________________________   INITIAL IMPRESSION / ASSESSMENT AND PLAN / ED COURSE  Pertinent labs & imaging results that were available during my care of the patient were reviewed by me and considered in my medical decision making (see chart for details).   This patient is Presenting for Evaluation of hip pain, which does require a range of treatment options, and is a complaint that involves a moderate risk of morbidity and mortality.  The Differential Diagnoses include fracture, dislocation, sprain, face cellulitis/abscess, etc.  Critical Interventions-    Medications  oxyCODONE -acetaminophen  (PERCOCET/ROXICET) 5-325 MG per tablet 1 tablet (1 tablet Oral Given 12/02/24 1016)  ketorolac  (TORADOL ) 30 MG/ML injection 30 mg (30 mg Intramuscular Given 12/02/24 1222)  dexamethasone  (DECADRON ) injection 10 mg (10 mg Intramuscular Given 12/02/24 1439)  doxycycline  (VIBRA -TABS) tablet 100 mg (100 mg Oral Given 12/02/24 1439)    Reassessment after intervention: pain improved.   Radiologic Tests Ordered, included XR hip and US . I  independently interpreted the images and agree with radiology interpretation.   Medical Decision Making: Summary:  Patient presents emergency department with hip pain and face rash.  Hip x-ray and ultrasound ordered.  Will follow.  Reevaluation with update and discussion with patient.  X-ray shows no acute abnormality.  Ultrasound shows no DVT.  Presentation seems most consistent with sciatica versus hamstring strain.  Plan for sciatica treatment.  Will cover with antibiotics for the face cellulitis.  Do not feel that incision and drainage is indicated at this time as the area seem primarily cellulitic.    Patient's presentation is most consistent with acute, uncomplicated illness.   Disposition: discharge  ____________________________________________  FINAL CLINICAL IMPRESSION(S) / ED DIAGNOSES  Final diagnoses:  Right hip pain  Sciatica of right side  Cellulitis of face     NEW OUTPATIENT MEDICATIONS STARTED DURING THIS VISIT:  Discharge Medication List as of 12/02/2024  2:35 PM     START taking these medications   Details  doxycycline  (VIBRAMYCIN ) 100 MG capsule Take 1 capsule (100 mg total) by  mouth 2 (two) times daily for 7 days., Starting Sat 12/02/2024, Until Sat 12/09/2024, Normal    gabapentin  (NEURONTIN ) 300 MG capsule Take 1 capsule (300 mg total) by mouth 3 (three) times daily as needed (leg pain)., Starting Sat 12/02/2024, Normal    ibuprofen  (ADVIL ) 800 MG tablet Take 1 tablet (800 mg total) by mouth every 8 (eight) hours as needed for moderate pain (pain score 4-6)., Starting Sat 12/02/2024, Normal    methocarbamol  (ROBAXIN ) 500 MG tablet Take 1 tablet (500 mg total) by mouth every 8 (eight) hours as needed., Starting Sat 12/02/2024, Normal        Note:  This document was prepared using Dragon voice recognition software and may include unintentional dictation errors.  Fonda Law, MD, [facility] Emergency Medicine    Dymin Dingledine, Fonda MATSU, MD 12/02/24 1515  "

## 2024-12-02 NOTE — Discharge Instructions (Signed)
You have been seen in the Emergency Department (ED)  today for back pain.  Your workup and exam have not shown any acute abnormalities and you are likely suffering from muscle strain or possible problems with your discs, but there is no treatment that will fix your symptoms at this time.  Please take Motrin (ibuprofen) as needed for your pain according to the instructions written on the box.  Alternatively, for the next five days you can take '800mg'$  three times daily with meals (it may upset your stomach).   Please follow up with your doctor as soon as possible regarding today's ED visit and your back pain.  Return to the ED for worsening back pain, fever, weakness or numbness of either leg, or if you develop either (1) an inability to urinate or have bowel movements, or (2) loss of your ability to control your bathroom functions (if you start having "accidents"), or if you develop other new symptoms that concern you.

## 2024-12-02 NOTE — ED Provider Triage Note (Signed)
 Emergency Medicine Provider Triage Evaluation Note  Benjamin Neal , a 48 y.o. male  was evaluated in triage.  Pt complains of multiple complaint. 1 week of pain to the back of R thigh, radiates to R hip and down to R foot.  Atraumatic.  Remote foot surgery 2 yrs ago.  No hx of DVT/PE.  Also report abscess to L side of face.  Is UTD with tdap. No fever, chills  Review of Systems  Positive: As above Negative: As above  Physical Exam  BP (!) 141/93   Pulse 70   Temp 98.4 F (36.9 C) (Oral)   Resp 18   Ht 5' 11 (1.803 m)   Wt 102.1 kg   SpO2 98%   BMI 31.38 kg/m  Gen:   Awake, no distress   Resp:  Normal effort  MSK:   Moves extremities without difficulty  Other:    Medical Decision Making  Medically screening exam initiated at 11:31 AM.  Appropriate orders placed.  [name] was informed that the remainder of the evaluation will be completed by another provider, this initial triage assessment does not replace that evaluation, and the importance of remaining in the ED until their evaluation is complete.     [name], PA-C 12/02/24 1132

## 2024-12-02 NOTE — ED Triage Notes (Addendum)
 Pt c.o right hip pain since Monday. The pain goes down the back of his leg. Pt ambulatory.  Pt also c.o bump on his left cheek. Started out as a pimple a week ago, he squeezed on it and it got bigger
